# Patient Record
Sex: Male | Born: 1995 | Race: Black or African American | Hispanic: No
Health system: Southern US, Community
[De-identification: ages and names within clinical notes are randomized; demographics above are authoritative.]

---
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
## 2013-03-09 ENCOUNTER — Encounter: Payer: Self-pay | Admitting: Internal Medicine

## 2013-03-09 ENCOUNTER — Ambulatory Visit (INDEPENDENT_AMBULATORY_CARE_PROVIDER_SITE_OTHER): Payer: 59 | Admitting: *Deleted

## 2013-03-09 DIAGNOSIS — Z23 Encounter for immunization: Secondary | ICD-10-CM | POA: Diagnosis not present

## 2019-05-18 ENCOUNTER — Other Ambulatory Visit: Payer: Self-pay | Admitting: Internal Medicine

## 2019-05-18 DIAGNOSIS — Z20822 Contact with and (suspected) exposure to covid-19: Secondary | ICD-10-CM

## 2019-05-24 LAB — NOVEL CORONAVIRUS, NAA: SARS-CoV-2, NAA: NOT DETECTED

## 2020-02-21 ENCOUNTER — Ambulatory Visit: Payer: 59 | Attending: Family

## 2020-02-21 DIAGNOSIS — Z23 Encounter for immunization: Secondary | ICD-10-CM

## 2020-02-21 NOTE — Progress Notes (Signed)
   [facility]  Name:  Martin Vega    MRN: [id number] DOB: 25-Nov-1995  02/21/2020  [name] was observed post Covid-19 immunization for 15 minutes without incident. He was provided with Vaccine Information Sheet and instruction to access the V-Safe system.   [name] was instructed to call 911 with any severe reactions post vaccine: Marland Kitchen Difficulty breathing  . Swelling of face and throat  . A fast heartbeat  . A bad rash all over body  . Dizziness and weakness   Immunizations Administered    Name Date Dose VIS Date Route   Moderna COVID-19 Vaccine 02/21/2020  4:06 PM 0.5 mL 10/2019 Intramuscular   Manufacturer: Moderna   Lot: [id number]   NDC: 25247-998-00
